# Patient Record
Sex: Female | Born: 2000 | Hispanic: Yes | Marital: Single | Smoking: Never smoker
Health system: Southern US, Community
[De-identification: ages and names within clinical notes are randomized; demographics above are authoritative.]

---
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
## 2023-04-04 IMAGING — CT CT ABD-PELV W/ CM
2 of 4 series · 16 of 46 positions shown, 18 images · IV contrast (agent unspecified)
Comparison: None Available.

CLINICAL DATA: Right lower quadrant abdominal pain.

EXAM:
CT ABDOMEN AND PELVIS WITH CONTRAST
TECHNIQUE: Multidetector CT imaging of the abdomen and pelvis was performed
using the standard protocol following bolus administration of
intravenous contrast.

[Series 2: axial st · axial · 0.98mm/px · z∈[-468,-23]mm · 13 of 99 slices shown, 15 images]
[im 5/99  soft-tissue]
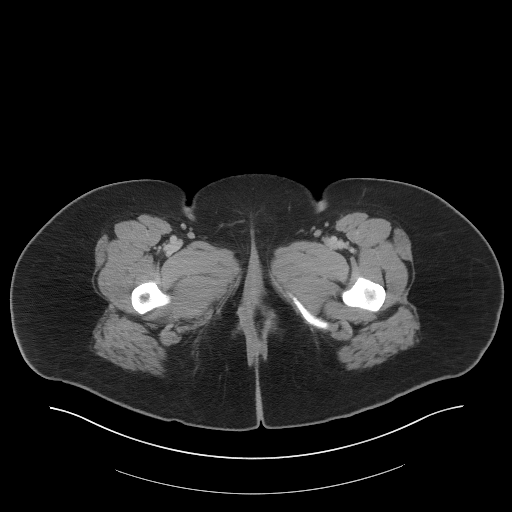
[im 5/99  bone]
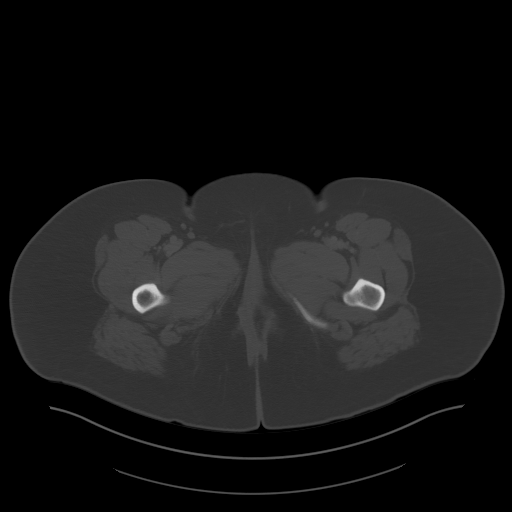
[im 13/99  soft-tissue]
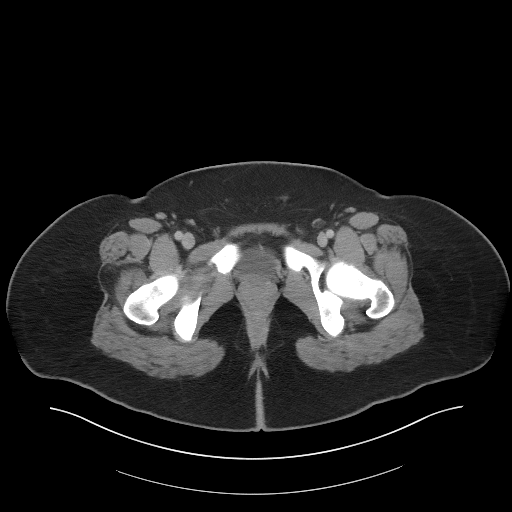
[im 21/99  soft-tissue]
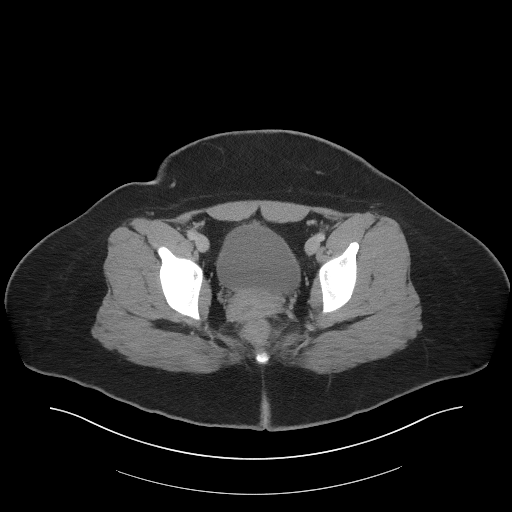
[im 29/99  soft-tissue]
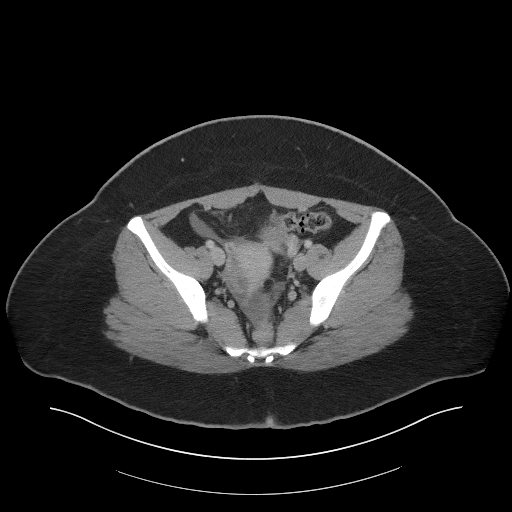
[im 33/99  soft-tissue]
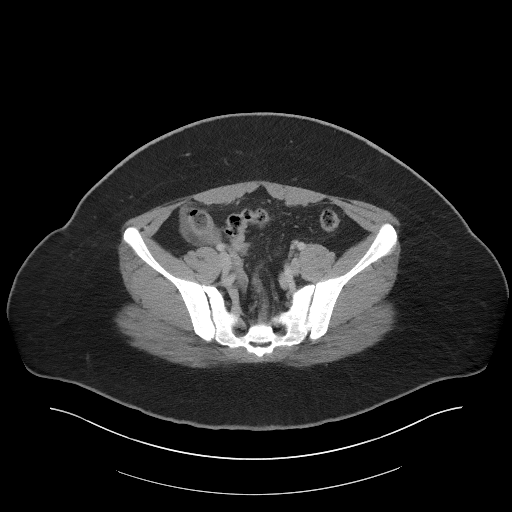
[im 41/99  soft-tissue]
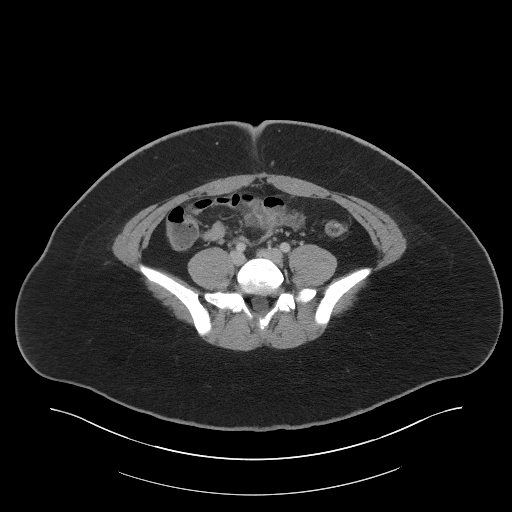
[im 50/99  soft-tissue]
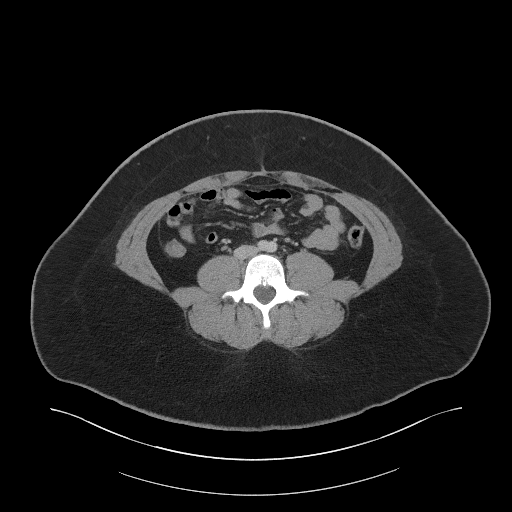
[im 58/99  soft-tissue]
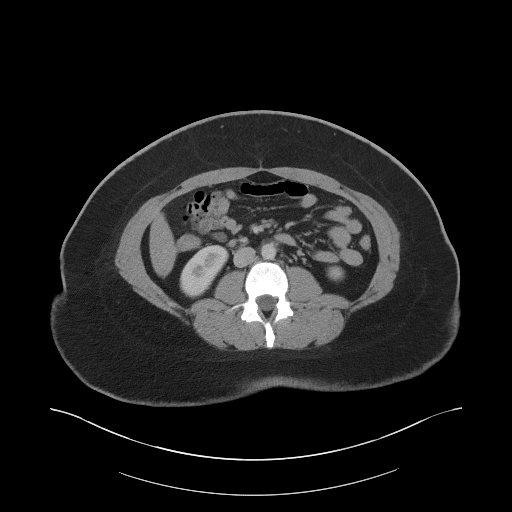
[im 66/99  soft-tissue]
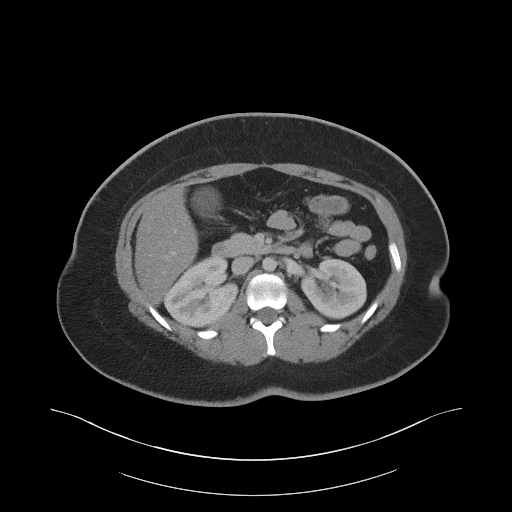
[im 66/99  bone]
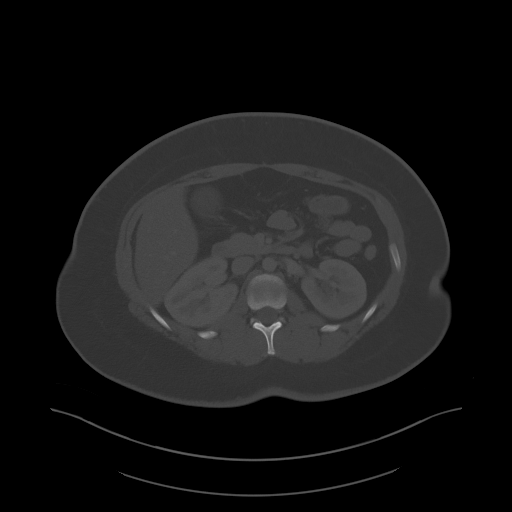
[im 70/99  soft-tissue]
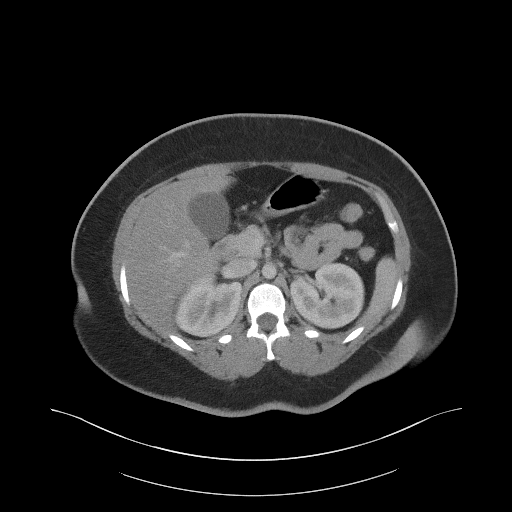
[im 78/99  soft-tissue]
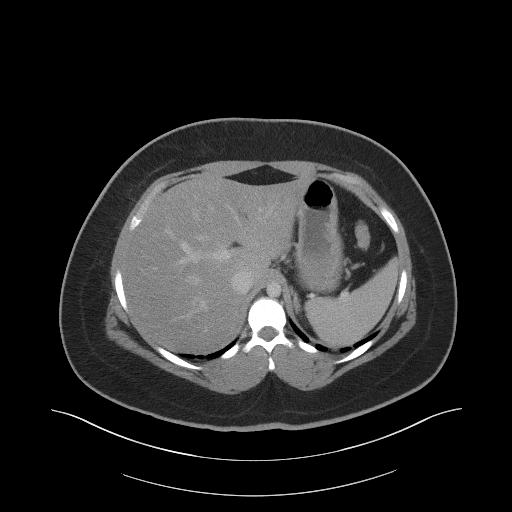
[im 86/99  soft-tissue]
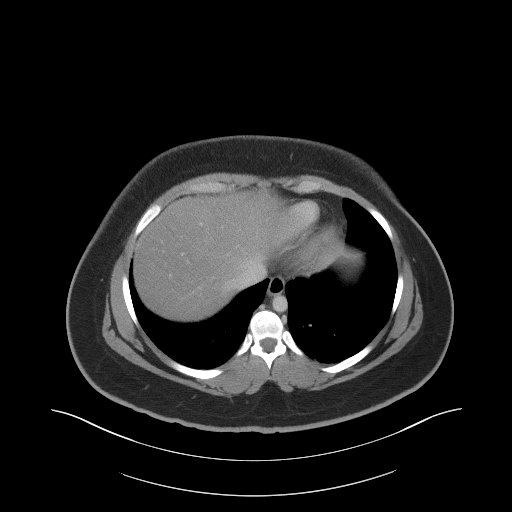
[im 94/99  soft-tissue]
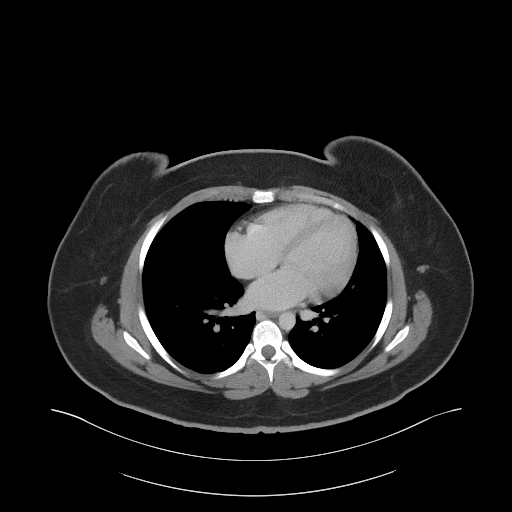

[Series 5: coronal st · coronal · 0.88mm/px · 3 of 102 slices shown]
[im 34/102  soft-tissue]
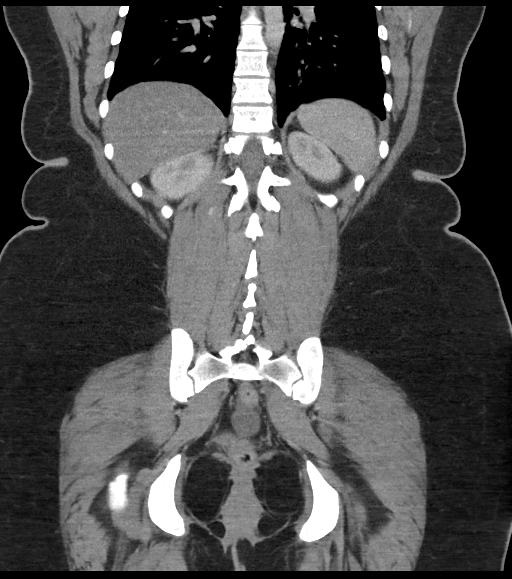
[im 45/102  soft-tissue]
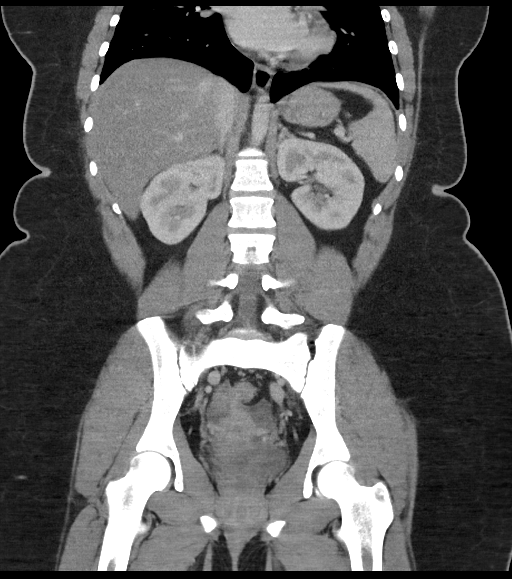
[im 57/102  soft-tissue]
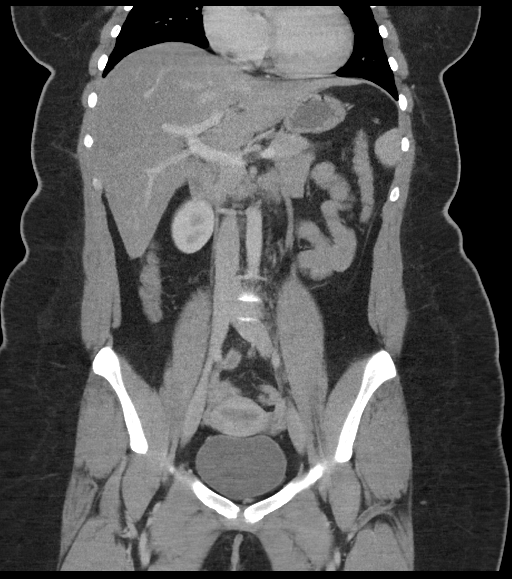

[16 of 46 positions shown; findings below may reference images not displayed]

RADIATION DOSE REDUCTION: This exam was performed according to the
departmental dose-optimization program which includes automated
exposure control, adjustment of the mA and/or kV according to
patient size and/or use of iterative reconstruction technique.

CONTRAST:  100mL OMNIPAQUE IOHEXOL 300 MG/ML  SOLN
FINDINGS: Lower chest: No acute abnormality.

Hepatobiliary: No gallstones or biliary dilatation is noted. Hepatic
steatosis.

Pancreas: Unremarkable. No pancreatic ductal dilatation or
surrounding inflammatory changes.

Spleen: Normal in size without focal abnormality.

Adrenals/Urinary Tract: Adrenal glands are unremarkable. Kidneys are
normal, without renal calculi, focal lesion, or hydronephrosis.
Bladder is unremarkable.

Stomach/Bowel: Stomach is within normal limits. Appendix appears
normal. No evidence of bowel wall thickening, distention, or
inflammatory changes.

Vascular/Lymphatic: No significant vascular findings are present. No
enlarged abdominal or pelvic lymph nodes.

Reproductive: Uterus is unremarkable. There is noted a mild amount
of free fluid in the pelvis as well as in the right lower quadrant,
and some of it may be slightly high density. This is concerning for
ruptured ovarian cyst.

Other: No hernia is noted.

Musculoskeletal: No acute or significant osseous findings.
IMPRESSION: Hepatic steatosis.

The appendix is unremarkable. There is noted a mild amount of free
fluid in the pelvis which extends to the right lower quadrant, with
possible small focus of hyperdensity present suggesting the
possibility of hemorrhage. This is concerning for ruptured ovarian
cyst. Pelvic ultrasound is recommended for further evaluation.
